# Patient Record
Sex: Male | Born: 1960
Health system: Southern US, Community
[De-identification: ages and names within clinical notes are randomized; demographics above are authoritative.]

## PROBLEM LIST (undated history)

## (undated) DIAGNOSIS — K8020 Calculus of gallbladder without cholecystitis without obstruction: Secondary | ICD-10-CM

## (undated) DIAGNOSIS — I6529 Occlusion and stenosis of unspecified carotid artery: Secondary | ICD-10-CM

## (undated) DIAGNOSIS — N189 Chronic kidney disease, unspecified: Secondary | ICD-10-CM

## (undated) DIAGNOSIS — I10 Essential (primary) hypertension: Secondary | ICD-10-CM

## (undated) DIAGNOSIS — E785 Hyperlipidemia, unspecified: Secondary | ICD-10-CM

## (undated) HISTORY — PX: TONSILLECTOMY: SUR1361

## (undated) HISTORY — DX: Occlusion and stenosis of unspecified carotid artery: I65.29

## (undated) HISTORY — DX: Hyperlipidemia, unspecified: E78.5

## (undated) SURGERY — LAPAROSCOPIC CHOLECYSTECTOMY
Anesthesia: General

---

## 2009-11-22 ENCOUNTER — Emergency Department: Payer: Self-pay | Admitting: Emergency Medicine

## 2011-11-04 ENCOUNTER — Ambulatory Visit: Payer: Self-pay | Admitting: General Practice

## 2011-12-17 ENCOUNTER — Ambulatory Visit: Payer: Self-pay | Admitting: General Practice

## 2012-03-22 ENCOUNTER — Ambulatory Visit: Payer: Self-pay | Admitting: General Practice

## 2013-07-07 ENCOUNTER — Emergency Department: Payer: Self-pay | Admitting: Emergency Medicine

## 2013-07-07 LAB — CBC
HCT: 47.8 % (ref 40.0–52.0)
HGB: 16.1 g/dL (ref 13.0–18.0)
MCH: 30.1 pg (ref 26.0–34.0)
MCHC: 33.8 g/dL (ref 32.0–36.0)
MCV: 89 fL (ref 80–100)
PLATELETS: 293 10*3/uL (ref 150–440)
RBC: 5.35 10*6/uL (ref 4.40–5.90)
RDW: 13.1 % (ref 11.5–14.5)
WBC: 19.4 10*3/uL — AB (ref 3.8–10.6)

## 2013-07-07 LAB — URINALYSIS, COMPLETE
BILIRUBIN, UR: NEGATIVE
Bacteria: NONE SEEN
Glucose,UR: NEGATIVE mg/dL (ref 0–75)
Ketone: NEGATIVE
LEUKOCYTE ESTERASE: NEGATIVE
NITRITE: NEGATIVE
PH: 5 (ref 4.5–8.0)
SQUAMOUS EPITHELIAL: NONE SEEN
Specific Gravity: 1.026 (ref 1.003–1.030)
WBC UR: <1 /HPF (ref 0–5)

## 2013-07-07 LAB — BASIC METABOLIC PANEL
Anion Gap: 10 (ref 7–16)
BUN: 22 mg/dL — ABNORMAL HIGH (ref 7–18)
Calcium, Total: 9.7 mg/dL (ref 8.5–10.1)
Chloride: 104 mmol/L (ref 98–107)
Co2: 28 mmol/L (ref 21–32)
Creatinine: 1.12 mg/dL (ref 0.60–1.30)
GLUCOSE: 147 mg/dL — AB (ref 65–99)
Osmolality: 289 (ref 275–301)
POTASSIUM: 3.5 mmol/L (ref 3.5–5.1)
SODIUM: 142 mmol/L (ref 136–145)

## 2013-07-18 DIAGNOSIS — N2889 Other specified disorders of kidney and ureter: Secondary | ICD-10-CM | POA: Insufficient documentation

## 2013-07-18 DIAGNOSIS — N281 Cyst of kidney, acquired: Secondary | ICD-10-CM | POA: Insufficient documentation

## 2015-08-01 ENCOUNTER — Other Ambulatory Visit: Payer: Self-pay | Admitting: Internal Medicine

## 2015-08-01 DIAGNOSIS — Z8249 Family history of ischemic heart disease and other diseases of the circulatory system: Secondary | ICD-10-CM | POA: Insufficient documentation

## 2015-08-01 DIAGNOSIS — Z8719 Personal history of other diseases of the digestive system: Secondary | ICD-10-CM | POA: Insufficient documentation

## 2015-08-01 DIAGNOSIS — I10 Essential (primary) hypertension: Secondary | ICD-10-CM | POA: Insufficient documentation

## 2015-08-01 DIAGNOSIS — R1010 Upper abdominal pain, unspecified: Secondary | ICD-10-CM

## 2015-08-01 DIAGNOSIS — Z87442 Personal history of urinary calculi: Secondary | ICD-10-CM | POA: Insufficient documentation

## 2015-08-04 ENCOUNTER — Ambulatory Visit
Admission: RE | Admit: 2015-08-04 | Discharge: 2015-08-04 | Disposition: A | Discharge status: Home / Self Care | Payer: BLUE CROSS/BLUE SHIELD | Source: Ambulatory Visit | Attending: Internal Medicine | Admitting: Internal Medicine

## 2015-08-04 DIAGNOSIS — K8020 Calculus of gallbladder without cholecystitis without obstruction: Secondary | ICD-10-CM | POA: Insufficient documentation

## 2015-08-04 DIAGNOSIS — R1010 Upper abdominal pain, unspecified: Secondary | ICD-10-CM

## 2015-08-05 ENCOUNTER — Other Ambulatory Visit: Payer: Self-pay

## 2015-08-07 ENCOUNTER — Encounter: Payer: Self-pay | Admitting: Surgery

## 2015-08-07 ENCOUNTER — Ambulatory Visit (INDEPENDENT_AMBULATORY_CARE_PROVIDER_SITE_OTHER): Payer: BLUE CROSS/BLUE SHIELD | Admitting: Surgery

## 2015-08-07 ENCOUNTER — Other Ambulatory Visit: Payer: Self-pay

## 2015-08-07 VITALS — BP 125/84 | HR 88 | Temp 98.7°F | Ht 72.0 in | Wt 216.0 lb

## 2015-08-07 DIAGNOSIS — K8020 Calculus of gallbladder without cholecystitis without obstruction: Secondary | ICD-10-CM

## 2015-08-07 NOTE — Patient Instructions (Addendum)
We will schedule your surgery on 08/21/2015 with Dr. Dahlia Byes at the [facility]. They will be contacting you to go over your medical record.  Please go and see your Primary care doctor in reference to your rash.

## 2015-08-08 ENCOUNTER — Telehealth: Payer: Self-pay | Admitting: Surgery

## 2015-08-08 ENCOUNTER — Encounter: Payer: Self-pay | Admitting: Surgery

## 2015-08-08 NOTE — Progress Notes (Signed)
Patient ID: Andre Wu, male   DOB: 1960/07/11, 55 y.o.   MRN: [id number]  History of Present Illness Andre Wu is a 55 y.o. male seen in consultation for biliary colic. Patient reports that he had a recent episode of right upper quadrant pain radiating to his back and shoulder. After some heavy meals. He developed some nausea as well. Went to the primary care doctor and was given some narcotics, PPI also has had a change in the hypertension medication and recently has developed a rash over his abdominal wall. No evidence of angioedema or airway issues related to anaphylactic reaction. From the biliary perspective he reports that the pain is intermittent and moderate in severity. The oral narcotics seem to help with his pain. No evidence of biliary obstruction. Further workup has included ultrasound revealing evidence of cholelithiasis, normal common bile duct and normal LFTs. The rest of his labs were reviewed and were normal ( PCP notes). He is otherwise healthy and is able to perform more than 6 Mets of activity without any shortness of breath or chest pain. No previous heart attacks in the previous strokes. 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  Past Medical History HTN  No Known Allergies  Current Outpatient Prescriptions  Medication Sig Dispense Refill  . hydrochlorothiazide (HYDRODIURIL) 25 MG tablet Take 25 mg by mouth.    . losartan (COZAAR) 50 MG tablet Take by mouth.    Marland Kitchen omeprazole (PRILOSEC) 20 MG capsule Take by mouth.    . traMADol (ULTRAM) 50 MG tablet Take 1 tablet by mouth 1 day or 1 dose.  1   No current facility-administered medications for this visit.    Family History No family history on file.    Social History Social History  Substance Use Topics  . Smoking status: Never Smoker   . Smokeless tobacco: None  . Alcohol Use: No      ROS 10 pt ROS was negative  Physical Exam Blood pressure 125/84, pulse 88, temperature 98.7 F (37.1  C), temperature source Oral, height 6' (1.829 m), weight 97.977 kg (216 lb).  CONSTITUTIONAL: NAD  EYES: Pupils equal, round, and reactive to light, Sclera non-icteric. EARS, NOSE, MOUTH AND THROAT: The oropharynx is clear. Oral mucosa is pink and moist. Hearing is intact to voice.  NECK: Trachea is midline, and there is no jugular venous distension. Thyroid is without palpable abnormalities. LYMPH NODES:  Lymph nodes in the neck are not enlarged. RESPIRATORY:  Lungs are clear, and breath sounds are equal bilaterally. Normal respiratory effort without pathologic use of accessory muscles. CARDIOVASCULAR: Heart is regular without murmurs, gallops, or rubs. GI: The abdomen is  soft, nontender, and nondistended. There were no palpable masses. There was no hepatosplenomegaly. There were normal bowel sounds. MUSCULOSKELETAL:  Normal muscle strength and tone in all four extremities.    SKIN: Andre Wu 3 hives on his abdominal wall there is some redness. No evidence of infection this is definitively an allergic reaction NEUROLOGIC:  Motor and sensation is grossly normal.  Cranial nerves are grossly intact. PSYCH:  Alert and oriented to person, place and time. Affect is normal.  Data Reviewed I have personally reviewed the patient's imaging and medical records.    Assessment/Plan Symptomatic gallstones. Discussed with the patient in detail and I do think that he will benefit from laparoscopic cholecystectomy. Before performing this I want to make sure that we will remove the offending agent that is causing the rash so we prevent any  further anesthetic or surgical complications. He will contact and the PCP office and so they can figure out which medication is causing this side effect. From the gallstone perspective I do think is reasonable to perform a laparoscopic cholecystectomy in 2-3 weeks once his reaction has resolved. I discussed the procedure in detail.  The patient was given Neurosurgeon.  We  discussed the risks and benefits of a laparoscopic cholecystectomy and possible cholangiogram including, but not limited to bleeding, infection, injury to surrounding structures such as the intestine or liver, bile leak, retained gallstones, need to convert to an open procedure, prolonged diarrhea, blood clots such as  DVT, common bile duct injury, anesthesia risks, and possible need for additional procedures.  The likelihood of improvement in symptoms and return to the patient's normal status is good. We discussed the typical post-operative recovery course. Patient is in agreement and all the questions were answered  Caroleen Hamman, MD [place] 08/08/2015, 9:05 AM

## 2015-08-08 NOTE — Telephone Encounter (Signed)
Spoke with Dr. Dahlia Byes and he would like to move patient's surgery 2 more weeks out on the schedule due to shingles. Please schedule patient to be seen in clinic with Dr. Dahlia Byes on 08/20/15 to ensure that he is ready for his surgery.

## 2015-08-08 NOTE — Telephone Encounter (Signed)
Pt's Wife was advised of pre op date/time and sx date. Sx: 08/21/15 with Dr Dahlia Byes @ [facility]--Laparoscopic cholecystectomy.  Pre op: [place] will contact the patient prior to surgery.   The wife has stated that the rash that was on the patient's stomach at his office visit yesterday was double the size this morning. Patient went to his PCP and was informed that he has shingles. The PCP stated to the patient that the pain could be from the shingles and not the gallbladder, even though the scan showed stones. Patient was given medication by PCP and was told that it was ok for him to continue with surgery   I advised her that I would contact the nurse and have her speak with Dr Dahlia Byes to see If the surgery will need to be rescheduled.   The phone number in the chart has been verified.

## 2015-08-08 NOTE — Telephone Encounter (Signed)
I spoke with patients wife Andre Wu due to husband resting. She stated the rash was double in size compared to when he was in the office yesterday. He is currently being treated by his PCP for shingles. She said her husband was having a lot of pain and discomfort from the shingles.  She stated at  this time they plan to move forward with having surgery on 08/21/15 due to having the gallstones and pain. I told her I would contact Dr.Pabon and let her know something for sure.   Text has been sent to Dr.Pabon to confirm moving forward with surgery.

## 2015-08-11 NOTE — Telephone Encounter (Signed)
I have called patient to advise him that we will need to change his surgery date due to him having shingles at this time. Patient would like to wait and see Dr Dahlia Byes towards the end of the month to see if his actual pain is coming from his shingles or from the gallbladder stones. I have scheduled him for his office visit on 09/10/15 with Dr Dahlia Byes @ 1:00pm in our [facility].   Patient was advised to call [place] if he needs to see [place] much sooner.

## 2015-09-10 ENCOUNTER — Ambulatory Visit: Payer: BLUE CROSS/BLUE SHIELD | Admitting: Surgery

## 2016-03-18 ENCOUNTER — Encounter: Payer: Self-pay | Admitting: *Deleted

## 2016-03-19 ENCOUNTER — Encounter: Payer: Self-pay | Admitting: *Deleted

## 2016-03-19 ENCOUNTER — Ambulatory Visit: Payer: BLUE CROSS/BLUE SHIELD | Admitting: Anesthesiology

## 2016-03-19 ENCOUNTER — Ambulatory Visit
Admission: RE | Admit: 2016-03-19 | Discharge: 2016-03-19 | Disposition: A | Discharge status: Home / Self Care | Payer: BLUE CROSS/BLUE SHIELD | Source: Ambulatory Visit | Attending: Gastroenterology | Admitting: Gastroenterology

## 2016-03-19 ENCOUNTER — Encounter
Admission: RE | Disposition: A | Discharge status: Home / Self Care | Payer: Self-pay | Source: Ambulatory Visit | Attending: Gastroenterology

## 2016-03-19 DIAGNOSIS — Z8371 Family history of colonic polyps: Secondary | ICD-10-CM | POA: Insufficient documentation

## 2016-03-19 DIAGNOSIS — D124 Benign neoplasm of descending colon: Secondary | ICD-10-CM | POA: Diagnosis not present

## 2016-03-19 DIAGNOSIS — K648 Other hemorrhoids: Secondary | ICD-10-CM | POA: Diagnosis not present

## 2016-03-19 DIAGNOSIS — N189 Chronic kidney disease, unspecified: Secondary | ICD-10-CM | POA: Insufficient documentation

## 2016-03-19 DIAGNOSIS — Z79899 Other long term (current) drug therapy: Secondary | ICD-10-CM | POA: Diagnosis not present

## 2016-03-19 DIAGNOSIS — I129 Hypertensive chronic kidney disease with stage 1 through stage 4 chronic kidney disease, or unspecified chronic kidney disease: Secondary | ICD-10-CM | POA: Diagnosis not present

## 2016-03-19 DIAGNOSIS — Z6829 Body mass index (BMI) 29.0-29.9, adult: Secondary | ICD-10-CM | POA: Diagnosis not present

## 2016-03-19 DIAGNOSIS — E669 Obesity, unspecified: Secondary | ICD-10-CM | POA: Insufficient documentation

## 2016-03-19 DIAGNOSIS — D123 Benign neoplasm of transverse colon: Secondary | ICD-10-CM | POA: Diagnosis not present

## 2016-03-19 DIAGNOSIS — Z1211 Encounter for screening for malignant neoplasm of colon: Secondary | ICD-10-CM | POA: Insufficient documentation

## 2016-03-19 DIAGNOSIS — K5730 Diverticulosis of large intestine without perforation or abscess without bleeding: Secondary | ICD-10-CM | POA: Diagnosis not present

## 2016-03-19 HISTORY — DX: Chronic kidney disease, unspecified: N18.9

## 2016-03-19 HISTORY — DX: Essential (primary) hypertension: I10

## 2016-03-19 HISTORY — DX: Calculus of gallbladder without cholecystitis without obstruction: K80.20

## 2016-03-19 HISTORY — PX: COLONOSCOPY WITH PROPOFOL: SHX5780

## 2016-03-19 SURGERY — COLONOSCOPY WITH PROPOFOL
Anesthesia: General

## 2016-03-19 MED ORDER — PROPOFOL 500 MG/50ML IV EMUL
INTRAVENOUS | Status: DC | PRN
  Administered 2016-03-19: 150 ug/kg/min via INTRAVENOUS

## 2016-03-19 MED ORDER — PHENYLEPHRINE HCL 10 MG/ML IJ SOLN
INTRAMUSCULAR | Status: DC | PRN
  Administered 2016-03-19: 200 ug via INTRAVENOUS

## 2016-03-19 MED ORDER — SODIUM CHLORIDE 0.9 % IV SOLN: INTRAVENOUS | Status: DC

## 2016-03-19 MED ORDER — MIDAZOLAM HCL 2 MG/2ML IJ SOLN
INTRAMUSCULAR | Status: AC
  Filled 2016-03-19: qty 2

## 2016-03-19 MED ORDER — LIDOCAINE HCL (CARDIAC) 20 MG/ML IV SOLN
INTRAVENOUS | Status: DC | PRN
  Administered 2016-03-19: 100 mg via INTRAVENOUS

## 2016-03-19 MED ORDER — LIDOCAINE HCL (PF) 2 % IJ SOLN
INTRAMUSCULAR | Status: AC
  Filled 2016-03-19: qty 2

## 2016-03-19 MED ORDER — PROPOFOL 500 MG/50ML IV EMUL
INTRAVENOUS | Status: AC
  Filled 2016-03-19: qty 50

## 2016-03-19 MED ORDER — MIDAZOLAM HCL 2 MG/2ML IJ SOLN
INTRAMUSCULAR | Status: DC | PRN
  Administered 2016-03-19: 2 mg via INTRAVENOUS

## 2016-03-19 MED ORDER — SODIUM CHLORIDE 0.9 % IV SOLN
INTRAVENOUS | Status: DC
  Administered 2016-03-19: 1000 mL via INTRAVENOUS
  Administered 2016-03-19: 15:00:00 via INTRAVENOUS

## 2016-03-19 MED ORDER — PROPOFOL 10 MG/ML IV BOLUS
INTRAVENOUS | Status: DC | PRN
  Administered 2016-03-19: 50 mg via INTRAVENOUS

## 2016-03-19 NOTE — Anesthesia Post-op Follow-up Note (Cosign Needed)
Anesthesia QCDR form completed.        

## 2016-03-19 NOTE — H&P (Signed)
Outpatient short stay form Pre-procedure 03/19/2016 2:45 PM Andre Sails MD  Primary Physician: Dr Tracie Harrier  Reason for visit:  Colonoscopy  History of present illness:  Patient is a 56 year old male presenting today as above. There is family history of colon polyps in his father. Rated his prep well. He does take a many dose of aspirin, 81 mg, that has been held for a couple days. He takes no other aspirin or blood thinning agents.    Current Facility-Administered Medications:  .  0.9 %  sodium chloride infusion, , Intravenous, Continuous, Andre Sails, MD .  0.9 %  sodium chloride infusion, , Intravenous, Continuous, Andre Sails, MD  Prescriptions Prior to Admission  Medication Sig Dispense Refill Last Dose  . losartan (COZAAR) 50 MG tablet Take by mouth.   03/19/2016 at 0900  . omeprazole (PRILOSEC) 20 MG capsule Take by mouth.   Not Taking at Unknown time  . traMADol (ULTRAM) 50 MG tablet Take 1 tablet by mouth 1 day or 1 dose.  1 Not Taking at Unknown time     No Known Allergies   Past Medical History:  Diagnosis Date  . Chronic kidney disease   . Gall stones   . Hypertension     Review of systems:      Physical Exam    Heart and lungs: Regular rate and rhythm without rub or gallop, lungs are bilaterally clear.    HEENT: Normocephalic atraumatic eyes are anicteric    Other:     Pertinant exam for procedure: Soft nontender nondistended bowel sounds positive normoactive.    Planned proceedures: Colonoscopy and indicated procedures. I have discussed the risks benefits and complications of procedures to include not limited to bleeding, infection, perforation and the risk of sedation and the patient wishes to proceed.    Andre Sails, MD Gastroenterology 03/19/2016  2:45 PM

## 2016-03-19 NOTE — Anesthesia Postprocedure Evaluation (Signed)
Anesthesia Post Note  Patient: Andre Wu  Procedure(s) Performed: Procedure(s) (LRB): COLONOSCOPY WITH PROPOFOL (N/A)  Patient location during evaluation: Endoscopy Anesthesia Type: General Level of consciousness: awake and alert Pain management: pain level controlled Vital Signs Assessment: post-procedure vital signs reviewed and stable Respiratory status: spontaneous breathing, nonlabored ventilation, respiratory function stable and patient connected to nasal cannula oxygen Cardiovascular status: blood pressure returned to baseline and stable Postop Assessment: no signs of nausea or vomiting Anesthetic complications: no     Last Vitals:  Vitals:   03/19/16 1614 03/19/16 1624  BP: 124/81 118/77  Pulse: 75   Resp: 13 14  Temp:      Last Pain:  Vitals:   03/19/16 1554  TempSrc: Tympanic                 Martha Clan

## 2016-03-19 NOTE — Anesthesia Preprocedure Evaluation (Signed)
Anesthesia Evaluation  Patient identified by MRN, date of birth, ID band Patient awake    Reviewed: Allergy & Precautions, NPO status , Patient's Chart, lab work & pertinent test results  Airway Mallampati: II       Dental  (+) Teeth Intact   Pulmonary neg pulmonary ROS,    breath sounds clear to auscultation       Cardiovascular hypertension, Pt. on medications  Rhythm:Regular Rate:Normal     Neuro/Psych negative neurological ROS     GI/Hepatic negative GI ROS, Neg liver ROS,   Endo/Other  Morbid obesity  Renal/GU negative Renal ROS     Musculoskeletal negative musculoskeletal ROS (+)   Abdominal (+) + obese,   Peds negative pediatric ROS (+)  Hematology negative hematology ROS (+)   Anesthesia Other Findings   Reproductive/Obstetrics                             Anesthesia Physical Anesthesia Plan  ASA: II  Anesthesia Plan: General   Post-op Pain Management:    Induction: Intravenous  Airway Management Planned: Natural Airway and Nasal Cannula  Additional Equipment:   Intra-op Plan:   Post-operative Plan:   Informed Consent: I have reviewed the patients History and Physical, chart, labs and discussed the procedure including the risks, benefits and alternatives for the proposed anesthesia with the patient or authorized representative who has indicated his/her understanding and acceptance.     Plan Discussed with: CRNA and Surgeon  Anesthesia Plan Comments:         Anesthesia Quick Evaluation

## 2016-03-19 NOTE — Transfer of Care (Signed)
Immediate Anesthesia Transfer of Care Note  Patient: Andre Wu  Procedure(s) Performed: Procedure(s): COLONOSCOPY WITH PROPOFOL (N/A)  Patient Location: Endoscopy Unit  Anesthesia Type:General  Level of Consciousness: awake, oriented and patient cooperative  Airway & Oxygen Therapy: Patient Spontanous Breathing and Patient connected to nasal cannula oxygen  Post-op Assessment: Report given to RN, Post -op Vital signs reviewed and stable and Patient moving all extremities X 4  Post vital signs: Reviewed and stable  Last Vitals:  Vitals:   03/19/16 1425  BP: (!) 155/94  Pulse: 88  Resp: 18  Temp: 36.6 C    Last Pain:  Vitals:   03/19/16 1425  TempSrc: Tympanic         Complications: No apparent anesthesia complications

## 2016-03-19 NOTE — Op Note (Signed)
[facility] Gastroenterology Patient Name: Andre Wu Procedure Date: 03/19/2016 2:58 PM MRN: [id number] Account #: [id number] Date of Birth: 08-11-1960 Admit Type: Outpatient Age: 56 Room: [facility] ENDO ROOM 3 Gender: Male Note Status: Finalized Procedure:            Colonoscopy Indications:          Colon cancer screening in patient at increased risk:                        Family history of 1st-degree relative with colon polyps Providers:            Lollie Sails, MD Referring MD:         Tracie Harrier, MD (Referring MD) Medicines:            Monitored Anesthesia Care Complications:        No immediate complications. Procedure:            Pre-Anesthesia Assessment:                       - ASA Grade Assessment: II - A patient with mild                        systemic disease.                       After obtaining informed consent, the colonoscope was                        passed under direct vision. Throughout the procedure,                        the patient's blood pressure, pulse, and oxygen                        saturations were monitored continuously. The                        Colonoscope was introduced through the anus and                        advanced to the the cecum, identified by appendiceal                        orifice and ileocecal valve. The colonoscopy was                        performed with moderate difficulty due to significant                        looping and a tortuous colon. Successful completion of                        the procedure was aided by changing the patient to a                        supine position, changing the patient to a prone                        position and using manual pressure. The patient  tolerated the procedure well. The quality of the bowel                        preparation was fair. Findings:      Multiple small-mouthed diverticula were found in the sigmoid colon and        descending colon.      A 4 mm polyp was found in the descending colon. The polyp was sessile.       The polyp was removed with a cold snare. Resection and retrieval were       complete.      A 3 mm polyp was found in the hepatic flexure. The polyp was sessile.       The polyp was removed with a cold snare. Resection and retrieval were       complete.      A 5 mm polyp was found in the transverse colon. The polyp was       semi-pedunculated. The polyp was removed with a cold snare. Resection       and retrieval were complete. To prevent bleeding after the polypectomy,       one hemostatic clip was successfully placed. There was no bleeding at       the end of the maneuver.      A 2 mm polyp was found in the transverse colon. The polyp was sessile.       The polyp was removed with a cold biopsy forceps. Resection and       retrieval were complete.      Non-bleeding internal hemorrhoids were found during retroflexion and       during anoscopy. The hemorrhoids were small and Grade I (internal       hemorrhoids that do not prolapse).      The digital rectal exam was normal. Impression:           - Preparation of the colon was fair.                       - Diverticulosis in the sigmoid colon and in the                        descending colon.                       - One 4 mm polyp in the descending colon, removed with                        a cold snare. Resected and retrieved.                       - One 3 mm polyp at the hepatic flexure, removed with a                        cold snare. Resected and retrieved.                       - One 5 mm polyp in the transverse colon, removed with                        a cold snare. Resected and retrieved. Clip was placed.                       -  One 2 mm polyp in the transverse colon, removed with                        a cold biopsy forceps. Resected and retrieved.                       - Non-bleeding internal hemorrhoids. Recommendation:        - Discharge patient to home. Procedure Code(s):    --- Professional ---                       [phone], Colonoscopy, flexible; with removal of tumor(s),                        polyp(s), or other lesion(s) by snare technique                       [id number], 27, Colonoscopy, flexible; with biopsy, single                        or multiple Diagnosis Code(s):    --- Professional ---                       Z83.71, Family history of colonic polyps                       K64.0, First degree hemorrhoids                       D12.4, Benign neoplasm of descending colon                       D12.3, Benign neoplasm of transverse colon (hepatic                        flexure or splenic flexure)                       K57.30, Diverticulosis of large intestine without                        perforation or abscess without bleeding CPT copyright 2016 American Medical Association. All rights reserved. The codes documented in this report are preliminary and upon coder review may  be revised to meet current compliance requirements. Lollie Sails, MD 03/19/2016 3:52:50 PM This report has been signed electronically. Number of Addenda: 0 Note Initiated On: 03/19/2016 2:58 PM Scope Withdrawal Time: 0 hours 21 minutes 54 seconds  Total Procedure Duration: 0 hours 41 minutes 42 seconds       [facility]

## 2016-03-22 ENCOUNTER — Encounter: Payer: Self-pay | Admitting: Gastroenterology

## 2016-03-23 LAB — SURGICAL PATHOLOGY

## 2017-06-24 ENCOUNTER — Telehealth: Payer: Self-pay | Admitting: *Deleted

## 2017-06-24 DIAGNOSIS — Z87891 Personal history of nicotine dependence: Secondary | ICD-10-CM

## 2017-06-24 DIAGNOSIS — Z122 Encounter for screening for malignant neoplasm of respiratory organs: Secondary | ICD-10-CM

## 2017-06-24 NOTE — Telephone Encounter (Signed)
Received referral for initial lung cancer screening scan. Contacted patient and obtained smoking history,(former, quit 14 years ago, 111 pack year) as well as answering questions related to screening process. Patient denies signs of lung cancer such as weight loss or hemoptysis. Patient denies comorbidity that would prevent curative treatment if lung cancer were found. Patient is scheduled for shared decision making visit and CT scan on 07/14/17.

## 2017-07-14 ENCOUNTER — Inpatient Hospital Stay: Payer: BLUE CROSS/BLUE SHIELD | Attending: Nurse Practitioner | Admitting: Nurse Practitioner

## 2017-07-14 ENCOUNTER — Ambulatory Visit
Admission: RE | Admit: 2017-07-14 | Discharge: 2017-07-14 | Disposition: A | Discharge status: Home / Self Care | Payer: BLUE CROSS/BLUE SHIELD | Source: Ambulatory Visit | Attending: Nurse Practitioner | Admitting: Nurse Practitioner

## 2017-07-14 ENCOUNTER — Encounter: Payer: Self-pay | Admitting: Nurse Practitioner

## 2017-07-14 DIAGNOSIS — Z122 Encounter for screening for malignant neoplasm of respiratory organs: Secondary | ICD-10-CM

## 2017-07-14 DIAGNOSIS — I2510 Atherosclerotic heart disease of native coronary artery without angina pectoris: Secondary | ICD-10-CM | POA: Insufficient documentation

## 2017-07-14 DIAGNOSIS — K8020 Calculus of gallbladder without cholecystitis without obstruction: Secondary | ICD-10-CM | POA: Insufficient documentation

## 2017-07-14 DIAGNOSIS — Z87891 Personal history of nicotine dependence: Secondary | ICD-10-CM

## 2017-07-14 DIAGNOSIS — I700 Atherosclerosis of aorta: Secondary | ICD-10-CM | POA: Diagnosis not present

## 2017-07-15 NOTE — Progress Notes (Signed)
In accordance with CMS guidelines, patient has met eligibility criteria including age, absence of signs or symptoms of lung cancer.  Social History   Tobacco Use  . Smoking status: Former Smoker    Packs/day: 3.00    Years: 37.00    Pack years: 111.00    Last attempt to quit: 2005    Years since quitting: 14.4  . Smokeless tobacco: Never Used  Substance Use Topics  . Alcohol use: No    Alcohol/week: 0.0 oz  . Drug use: No      A shared decision-making session was conducted prior to the performance of CT scan. This includes one or more decision aids, includes benefits and harms of screening, follow-up diagnostic testing, over-diagnosis, false positive rate, and total radiation exposure.   Counseling on the importance of adherence to annual lung cancer LDCT screening, impact of co-morbidities, and ability or willingness to undergo diagnosis and treatment is imperative for compliance of the program.   Counseling on the importance of continued smoking cessation for former smokers; the importance of smoking cessation for current smokers, and information about tobacco cessation interventions have been given to patient including [place] and 1800 quit [place] programs.   Written order for lung cancer screening with LDCT has been given to the patient and any and all questions have been answered to the best of my abilities.    Yearly follow up will be coordinated by Burgess Estelle, Thoracic Navigator.  Beckey Rutter, DNP, AGNP-C [place] at [facility] [phone] (work cell) [phone] (office) 07/15/17 8:58 AM

## 2017-07-18 ENCOUNTER — Telehealth: Payer: Self-pay | Admitting: *Deleted

## 2017-07-18 NOTE — Telephone Encounter (Signed)
Notified patient of LDCT lung cancer screening program results. Also notified of incidental findings noted below and is encouraged to discuss further with PCP who will receive a copy of this note and/or the CT report. Patient verbalizes understanding. Patient is aware that due to quit date he will not be eligible for continued lung screening scans. However, his PCP may choose to order imaging to follow up on known lung nodule.   IMPRESSION: 1. Lung-RADS 2, benign appearance or behavior. Continue annual screening with low-dose chest CT without contrast in 12 months. 2. Three-vessel coronary atherosclerosis. 3. Cholelithiasis.

## 2018-01-31 IMAGING — US US ABDOMEN LIMITED
2 series · 14 of 25 positions shown · non-contrast
Comparison: CT scan of July 07, 2013.

CLINICAL DATA: Upper abdominal pain.

EXAM:
US ABDOMEN LIMITED - RIGHT UPPER QUADRANT

[Series 1: us abdomen limited · 0.26mm/px · 13 of 54 slices shown (1 of 2)]
[im 1/54]
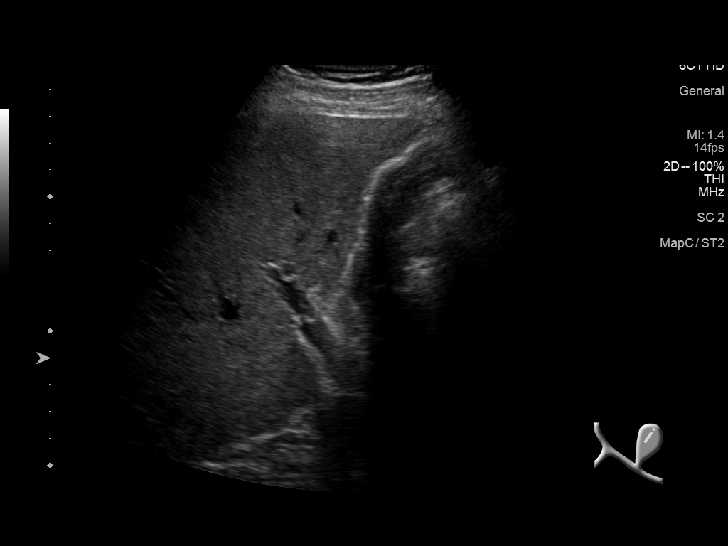
[im 5/54]
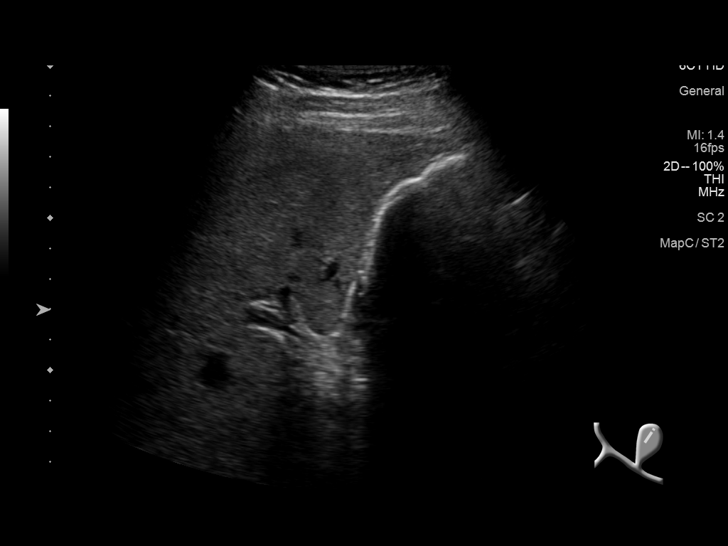
[im 10/54]
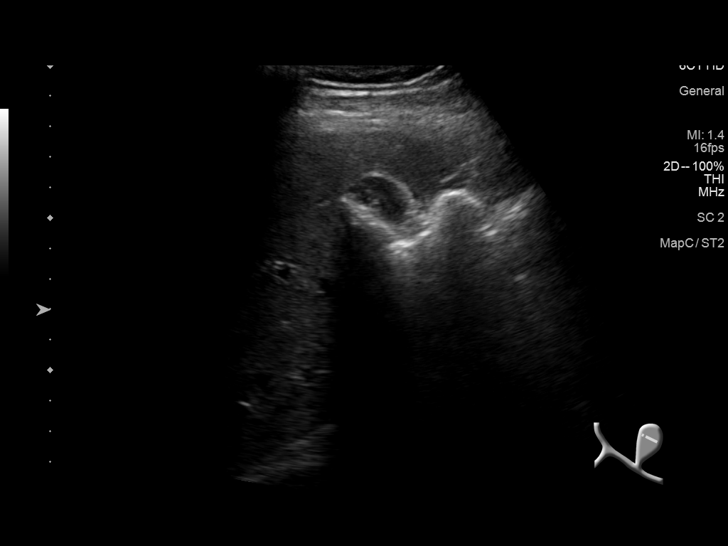
[im 14/54]
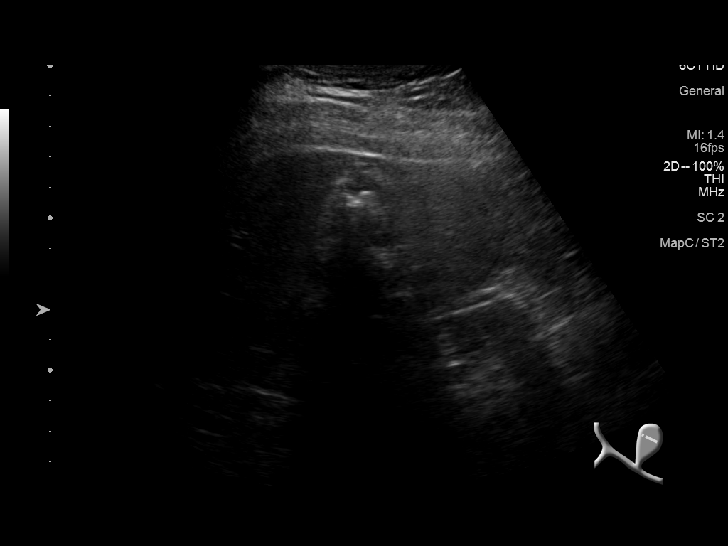
[im 19/54]
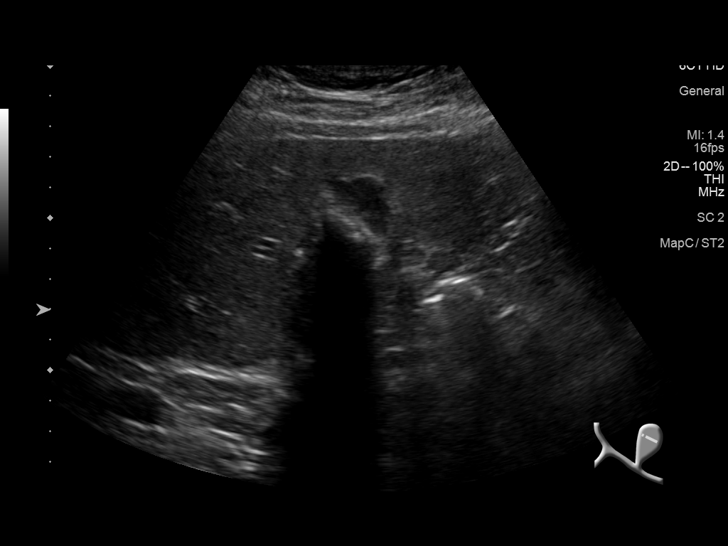
[im 21/54]
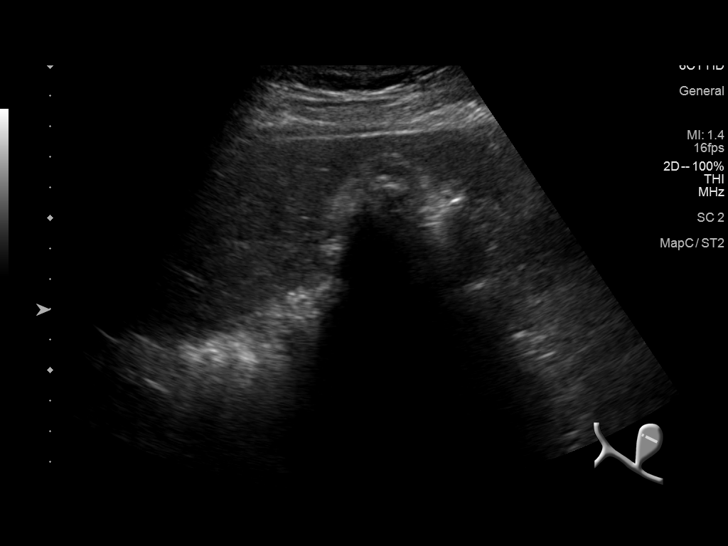
[im 26/54]
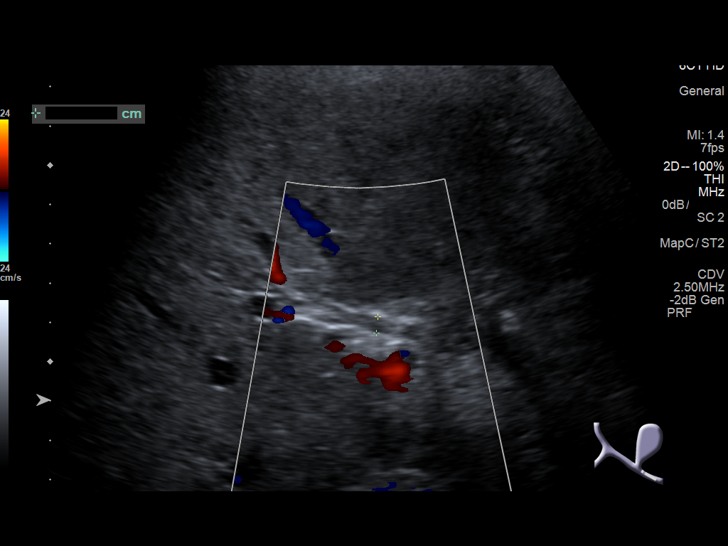
[im 30/54]
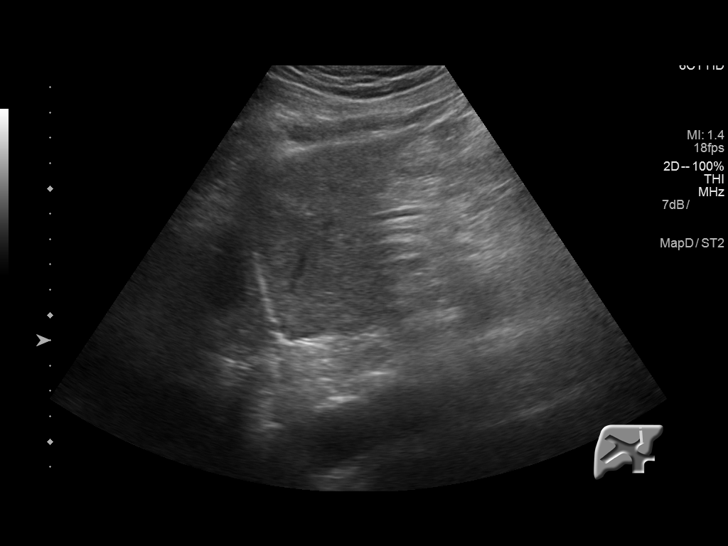
[im 35/54]
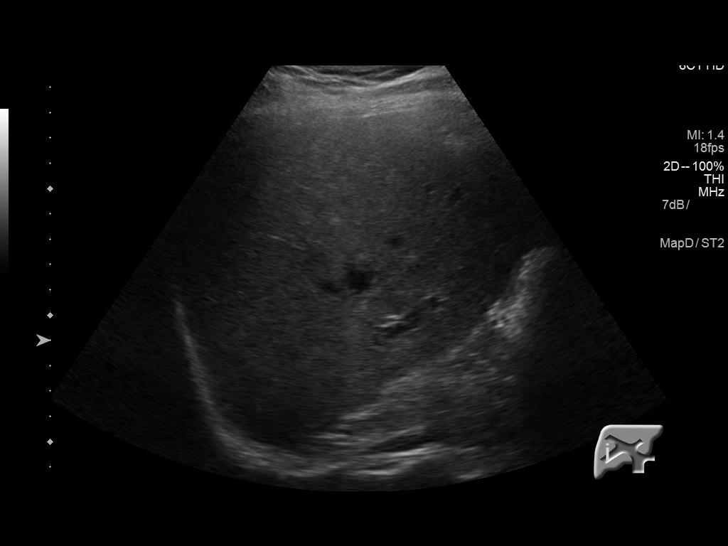
[im 37/54]
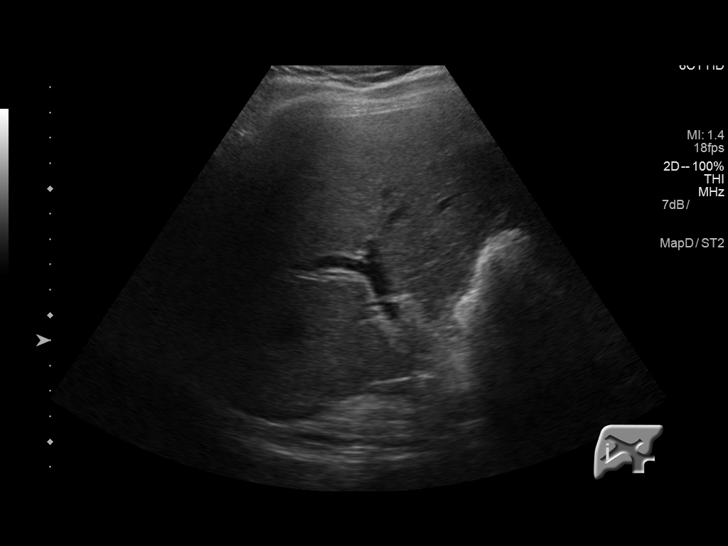
[im 42/54]
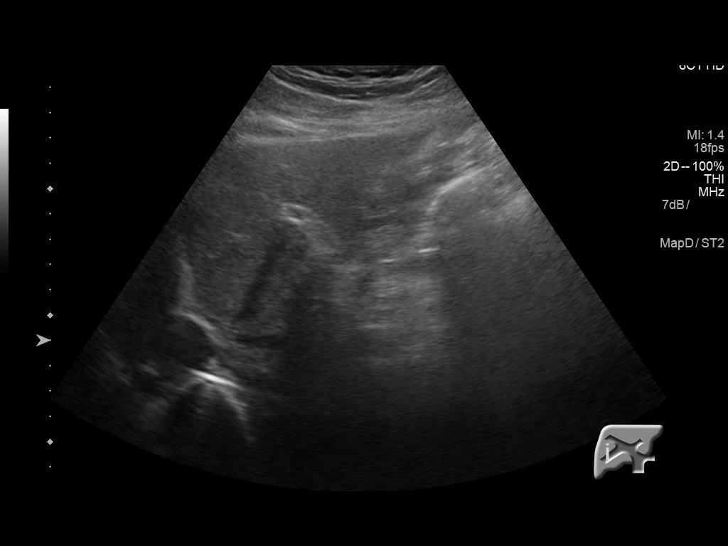
[im 47/54]
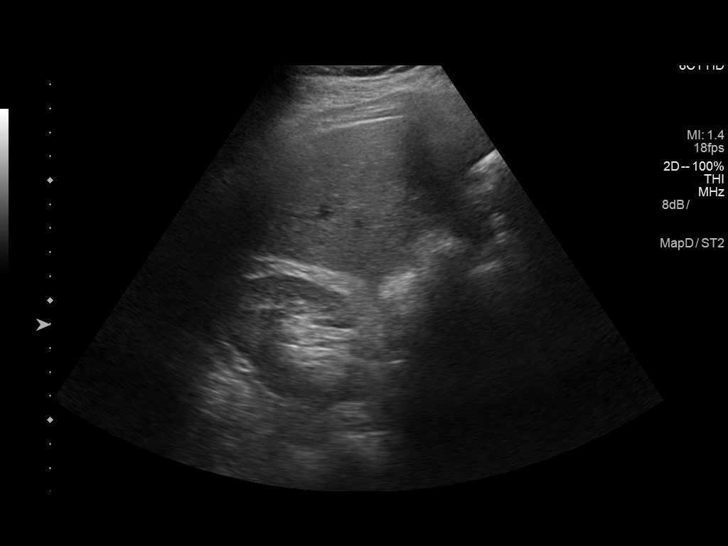
[im 51/54]
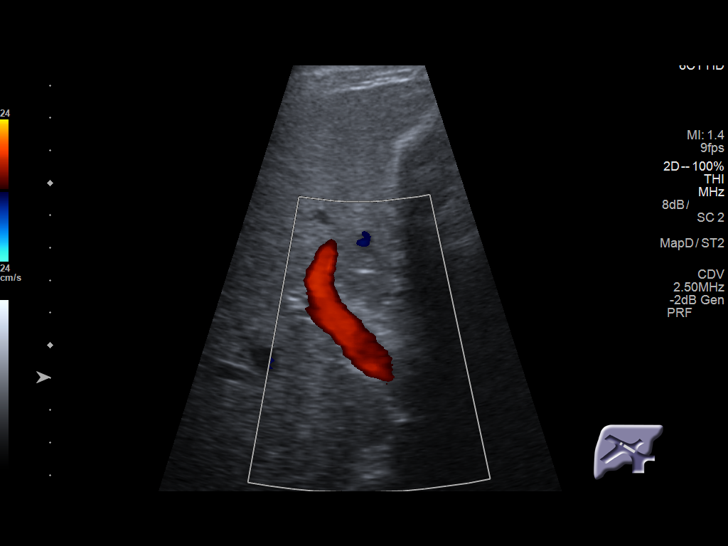

[Series 2001: us abdomen limited · 0.23mm/px · 1 of 1 slices shown (2 of 2)]
[im 1/1]
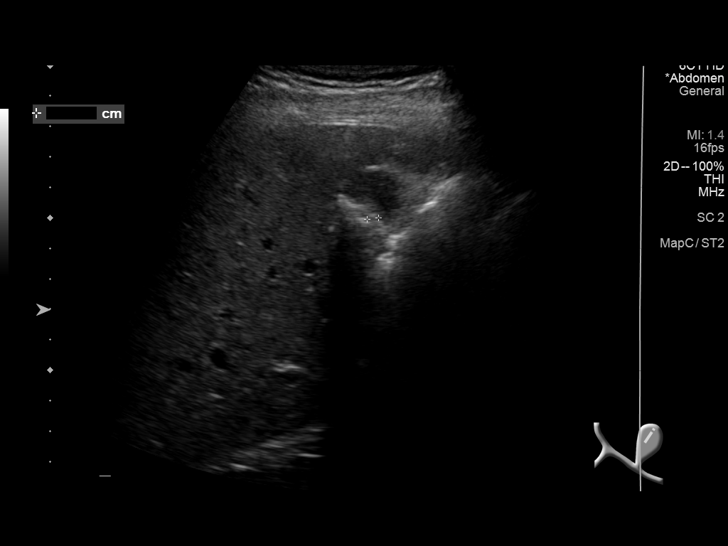

[14 of 25 positions shown; findings below may reference images not displayed]

FINDINGS: Gallbladder:

Multiple gallstones are noted with the largest measuring 3.8 mm.
Sludge is noted as well. No significant gallbladder wall thickening
or pericholecystic fluid is noted. No sonographic Murphy's sign is
noted.

Common bile duct:

Diameter: 4.2 mm which is within normal limits.

Liver:

No focal lesion identified. Within normal limits in parenchymal
echogenicity.
IMPRESSION: Cholelithiasis is noted without definite gallbladder wall thickening
or pericholecystic fluid. If there is clinical concern for
cholecystitis, HIDA scan is recommended for further evaluation.

## 2018-05-18 ENCOUNTER — Encounter: Payer: Self-pay | Admitting: *Deleted

## 2018-07-28 ENCOUNTER — Telehealth: Payer: Self-pay | Admitting: *Deleted

## 2018-07-28 ENCOUNTER — Telehealth: Payer: Self-pay

## 2018-07-28 DIAGNOSIS — Z122 Encounter for screening for malignant neoplasm of respiratory organs: Secondary | ICD-10-CM

## 2018-07-28 DIAGNOSIS — Z87891 Personal history of nicotine dependence: Secondary | ICD-10-CM

## 2018-07-28 NOTE — Telephone Encounter (Signed)
Patient has been notified that annual lung cancer screening low dose CT scan is due currently or will be in near future. Confirmed that patient is within the age range of 55-77, and asymptomatic, (no signs or symptoms of lung cancer). Patient denies illness that would prevent curative treatment for lung cancer if found. Verified smoking history, (former, quit 14 year ago, 111 pack year). The shared decision making visit was done 07/15/17. Patient is agreeable for CT scan being scheduled.

## 2018-07-28 NOTE — Telephone Encounter (Signed)
Call pt regarding lung screening. Pt is a former smoker. Pt denies any new health issue at this time. Scan is scheduled for June 16 th at 1:45.

## 2018-08-01 ENCOUNTER — Other Ambulatory Visit: Payer: Self-pay

## 2018-08-01 ENCOUNTER — Ambulatory Visit
Admission: RE | Admit: 2018-08-01 | Discharge: 2018-08-01 | Disposition: A | Discharge status: Home / Self Care | Payer: BC Managed Care – PPO | Source: Ambulatory Visit | Attending: Oncology | Admitting: Oncology

## 2018-08-01 DIAGNOSIS — Z87891 Personal history of nicotine dependence: Secondary | ICD-10-CM | POA: Diagnosis present

## 2018-08-01 DIAGNOSIS — Z122 Encounter for screening for malignant neoplasm of respiratory organs: Secondary | ICD-10-CM

## 2018-08-03 ENCOUNTER — Encounter: Payer: Self-pay | Admitting: *Deleted

## 2018-09-13 DIAGNOSIS — G4733 Obstructive sleep apnea (adult) (pediatric): Secondary | ICD-10-CM | POA: Diagnosis not present

## 2018-09-23 NOTE — Progress Notes (Signed)
Cardiology Office Note  Date:  09/25/2018   ID:  Andre Wu, DOB 1960-09-23, MRN [id number]  PCP:  Tracie Harrier, MD   Chief Complaint  Patient presents with  . other    Family hx CAD c/o chest pain when working out. Meds reviewed verbally with pt.    HPI:  Mr. Andre Wu is a 58 year old gentleman with past medical history of HTN GERD Former smoker, quit 58 yo Family hx of cad Referred by Dr. Ginette Pitman for risk factor profile evaluation, review of CT scan chest  In general he reports doing well, used to work out at the gym but not as much recently Weight has been slowly trending upwards, glucose levels trending up Recent glucose 120  Denies any anginal symptoms on a regular basis In the past if he pushed his body at the gym to the extreme would have some chest tightness and shortness of breath.  But only under very heavy exertion Resolved with resting  CT scan chest dated 08/01/2018 Images pulled up and reviewed with him in detail Minimal if any atherosclerotic calcification of the aorta in the arch Mild diffuse three-vessel coronary calcification in the coronary arteries. Centrilobular emphysema.  EKG personally reviewed by myself on todays visit Shows normal sinus rhythm with rate 72 bpm no significant ST or T wave changes   PMH:   has a past medical history of Carotid artery occlusion, Chronic kidney disease, Gall stones, Hyperlipidemia, and Hypertension.  PSH:    Past Surgical History:  Procedure Laterality Date  . COLONOSCOPY WITH PROPOFOL N/A 03/19/2016   Procedure: COLONOSCOPY WITH PROPOFOL;  Surgeon: Lollie Sails, MD;  Location: [facility] ENDOSCOPY;  Service: Endoscopy;  Laterality: N/A;  . TONSILLECTOMY      Current Outpatient Medications  Medication Sig Dispense Refill  . Fluticasone-Umeclidin-Vilant (TRELEGY ELLIPTA) 100-62.5-25 MCG/INH AEPB Inhale into the lungs daily.    Marland Kitchen omeprazole (PRILOSEC) 20 MG capsule Take by mouth.    .  telmisartan (MICARDIS) 40 MG tablet Take 40 mg by mouth daily.    Marland Kitchen aspirin EC 81 MG tablet Take by mouth daily.    Marland Kitchen atorvastatin (LIPITOR) 20 MG tablet Take 1 tablet (20 mg total) by mouth daily. 30 tablet 6  . ezetimibe (ZETIA) 10 MG tablet Take 1 tablet (10 mg total) by mouth daily. 30 tablet 6   No current facility-administered medications for this visit.      Allergies:   Patient has no known allergies.   Social History:  The patient  reports that he quit smoking about 15 years ago. He has a 111.00 pack-year smoking history. His smokeless tobacco use includes chew. He reports that he does not drink alcohol or use drugs.   Family History:   family history includes Heart attack in his brother, father, paternal uncle, paternal uncle, paternal uncle, and paternal uncle.    Review of Systems: Review of Systems  Constitutional: Negative.   HENT: Negative.   Respiratory: Negative.   Cardiovascular: Negative.   Gastrointestinal: Negative.   Musculoskeletal: Negative.   Neurological: Negative.   Psychiatric/Behavioral: Negative.   All other systems reviewed and are negative.   PHYSICAL EXAM: VS:  BP 130/84 (BP Location: Right Arm, Patient Position: Sitting, Cuff Size: Normal)   Pulse 72   Ht 6' (1.829 m)   Wt 238 lb 8 oz (108.2 kg)   BMI 32.35 kg/m  , BMI Body mass index is 32.35 kg/m. GEN: Well nourished, well developed, in no acute distress HEENT: normal  Neck: no JVD, carotid bruits, or masses Cardiac: RRR; no murmurs, rubs, or gallops,no edema  Respiratory:  clear to auscultation bilaterally, normal work of breathing GI: soft, nontender, nondistended, + BS MS: no deformity or atrophy Skin: warm and dry, no rash Neuro:  Strength and sensation are intact Psych: euthymic mood, full affect   Recent Labs: No results found for requested labs within last 8760 hours.    Lipid Panel No results found for: CHOL, HDL, LDLCALC, TRIG    Wt Readings from Last 3 Encounters:   09/25/18 238 lb 8 oz (108.2 kg)  08/01/18 230 lb (104.3 kg)  07/14/17 235 lb (106.6 kg)       ASSESSMENT AND PLAN:  Problem List Items Addressed This Visit      Cardiology Problems   Benign essential HTN   Relevant Medications   telmisartan (MICARDIS) 40 MG tablet   aspirin EC 81 MG tablet   ezetimibe (ZETIA) 10 MG tablet   atorvastatin (LIPITOR) 20 MG tablet    Other Visit Diagnoses    Aortic atherosclerosis ([place])    -  Primary   Relevant Medications   telmisartan (MICARDIS) 40 MG tablet   aspirin EC 81 MG tablet   ezetimibe (ZETIA) 10 MG tablet   atorvastatin (LIPITOR) 20 MG tablet   Coronary artery disease due to calcified coronary lesion       Relevant Medications   telmisartan (MICARDIS) 40 MG tablet   aspirin EC 81 MG tablet   ezetimibe (ZETIA) 10 MG tablet   atorvastatin (LIPITOR) 20 MG tablet   Other Relevant Orders   EKG 12-Lead   Former smoker       Mixed hyperlipidemia       Relevant Medications   telmisartan (MICARDIS) 40 MG tablet   aspirin EC 81 MG tablet   ezetimibe (ZETIA) 10 MG tablet   atorvastatin (LIPITOR) 20 MG tablet   Gastroesophageal reflux disease without esophagitis         Minimal coronary calcification seen on CT scan chest Not having significant symptoms concerning for angina We have recommended more aggressive regimen for his cholesterol We will increase Lipitor up to 20 mg daily We will add Zetia 10 mg daily for goal LDL less than 70 We did discuss anginal symptoms to watch for  Aortic atherosclerosis Minimal noted in the arch Images pulled up and discussed with him Plan as above  Hypertension Blood pressure is well controlled on today's visit. No changes made to the medications.  Disposition:   F/U as needed  Extensive time spent discussing his disease, CT scan images pulled up and reviewed with him in detail Types of cardiac testing discussed with him including stress testing Various treatment options discussed for his  lipid management  Total encounter time more than 60 minutes  Greater than 50% was spent in counseling and coordination of care with the patient    Signed, Esmond Plants, M.D., Ph.D. [place], [place]

## 2018-09-25 ENCOUNTER — Encounter: Payer: Self-pay | Admitting: Cardiovascular Disease

## 2018-09-25 ENCOUNTER — Other Ambulatory Visit: Payer: Self-pay

## 2018-09-25 ENCOUNTER — Ambulatory Visit (INDEPENDENT_AMBULATORY_CARE_PROVIDER_SITE_OTHER): Payer: 59 | Admitting: Cardiovascular Disease

## 2018-09-25 VITALS — BP 130/84 | HR 72 | Ht 72.0 in | Wt 238.5 lb

## 2018-09-25 DIAGNOSIS — I2510 Atherosclerotic heart disease of native coronary artery without angina pectoris: Secondary | ICD-10-CM

## 2018-09-25 DIAGNOSIS — I2584 Coronary atherosclerosis due to calcified coronary lesion: Secondary | ICD-10-CM | POA: Diagnosis not present

## 2018-09-25 DIAGNOSIS — Z87891 Personal history of nicotine dependence: Secondary | ICD-10-CM

## 2018-09-25 DIAGNOSIS — K219 Gastro-esophageal reflux disease without esophagitis: Secondary | ICD-10-CM

## 2018-09-25 DIAGNOSIS — E782 Mixed hyperlipidemia: Secondary | ICD-10-CM | POA: Diagnosis not present

## 2018-09-25 DIAGNOSIS — I10 Essential (primary) hypertension: Secondary | ICD-10-CM

## 2018-09-25 DIAGNOSIS — I700 Atherosclerosis of aorta: Secondary | ICD-10-CM

## 2018-09-25 MED ORDER — ATORVASTATIN CALCIUM 20 MG PO TABS: 20.0000 mg | ORAL_TABLET | Freq: Every day | ORAL | 6 refills | Status: AC

## 2018-09-25 MED ORDER — EZETIMIBE 10 MG PO TABS: 10.0000 mg | ORAL_TABLET | Freq: Every day | ORAL | 6 refills | Status: DC

## 2018-09-25 NOTE — Patient Instructions (Signed)
Medication Instructions:  Please increase the atorvastatin up to 20 mg daily Add zetia 10 mg daily  If you need a refill on your cardiac medications before your next appointment, please call your pharmacy.    Lab work: No new labs needed   If you have labs (blood work) drawn today and your tests are completely normal, you will receive your results only by: Marland Kitchen MyChart Message (if you have MyChart) OR . A paper copy in the mail If you have any lab test that is abnormal or we need to change your treatment, we will call you to review the results.   Testing/Procedures: No new testing needed   Follow-Up: At [facility], you and your health needs are our priority.  As part of our continuing mission to provide you with exceptional heart care, we have created designated Provider Care Teams.  These Care Teams include your primary Cardiologist (physician) and Advanced Practice Providers (APPs -  Physician Assistants and Nurse Practitioners) who all work together to provide you with the care you need, when you need it.  . You will need a follow up appointment as needed   . Providers on your designated Care Team:   . Murray Hodgkins, NP . Christell Faith, PA-C . Marrianne Mood, PA-C  Any Other Special Instructions Will Be Listed Below (If Applicable).  For educational health videos Log in to : [email or web address] Or : [email or web address], password : [email or web address]

## 2019-02-14 DIAGNOSIS — J40 Bronchitis, not specified as acute or chronic: Secondary | ICD-10-CM | POA: Diagnosis not present

## 2019-02-14 DIAGNOSIS — Z20828 Contact with and (suspected) exposure to other viral communicable diseases: Secondary | ICD-10-CM | POA: Diagnosis not present

## 2019-02-19 DIAGNOSIS — J40 Bronchitis, not specified as acute or chronic: Secondary | ICD-10-CM | POA: Diagnosis not present

## 2019-02-19 DIAGNOSIS — Z20822 Contact with and (suspected) exposure to covid-19: Secondary | ICD-10-CM | POA: Diagnosis not present

## 2019-03-18 ENCOUNTER — Other Ambulatory Visit: Payer: Self-pay | Admitting: Cardiovascular Disease

## 2019-06-25 ENCOUNTER — Other Ambulatory Visit: Payer: Self-pay | Admitting: Internal Medicine

## 2019-06-25 DIAGNOSIS — R1010 Upper abdominal pain, unspecified: Secondary | ICD-10-CM

## 2019-06-26 ENCOUNTER — Ambulatory Visit
Admission: RE | Admit: 2019-06-26 | Discharge: 2019-06-26 | Disposition: A | Discharge status: Home / Self Care | Payer: 59 | Source: Ambulatory Visit | Attending: Internal Medicine | Admitting: Internal Medicine

## 2019-06-26 ENCOUNTER — Other Ambulatory Visit: Payer: Self-pay

## 2019-06-26 DIAGNOSIS — R1010 Upper abdominal pain, unspecified: Secondary | ICD-10-CM | POA: Insufficient documentation

## 2019-11-16 DEATH — deceased

## 2021-01-28 IMAGING — CT CT CHEST LUNG CANCER SCREENING LOW DOSE
2 of 5 series · 15 of 40 positions shown, 18 images · non-contrast
Comparison: 07/14/2017.

CLINICAL DATA: Former smoker, quit 14 years ago, 111 pack-year
history.

EXAM:
CT CHEST WITHOUT CONTRAST LOW-DOSE FOR LUNG CANCER SCREENING
TECHNIQUE: Multidetector CT imaging of the chest was performed following the
standard protocol without IV contrast.

[Series 3: lung · axial · 0.74mm/px · z∈[-1272,-927]mm · 12 of 381 slices shown, 15 images (1 of 2)]
[im 18/381  mediastinal]
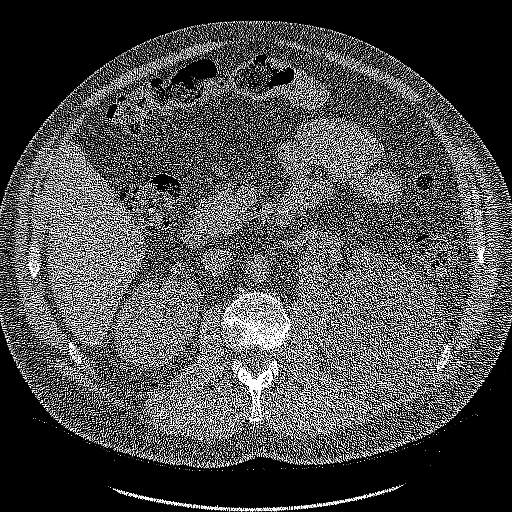
[im 18/381  lung]
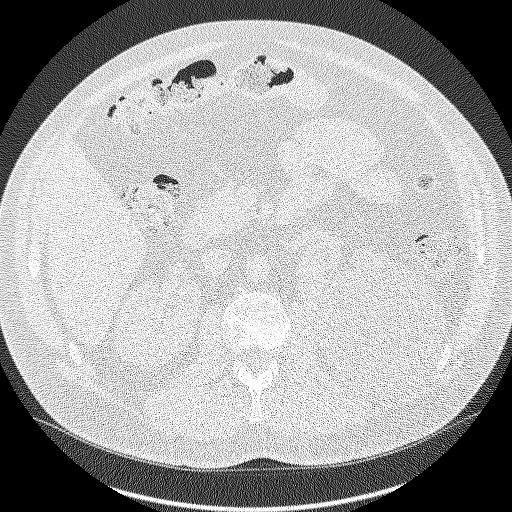
[im 52/381  lung]
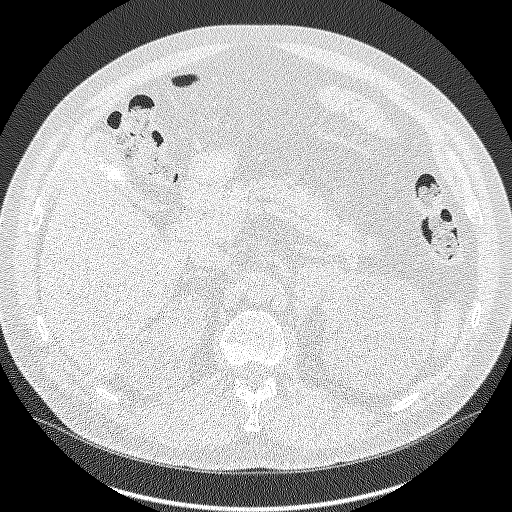
[im 87/381  lung]
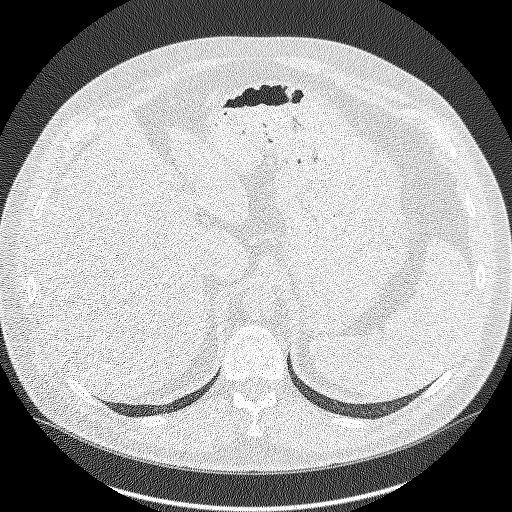
[im 121/381  lung]
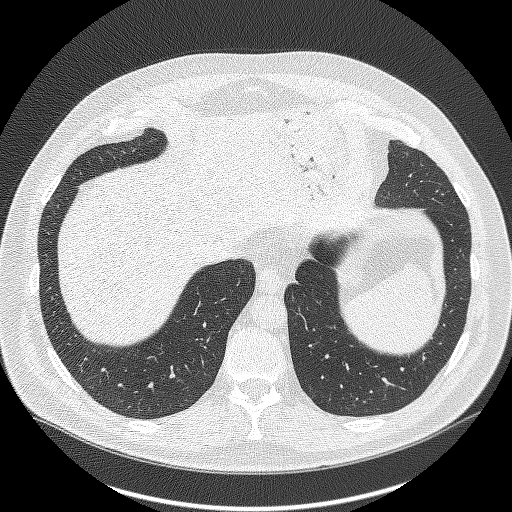
[im 139/381  mediastinal]
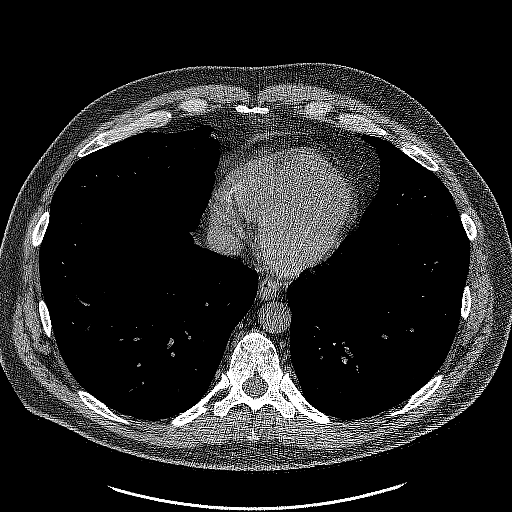
[im 139/381  lung]
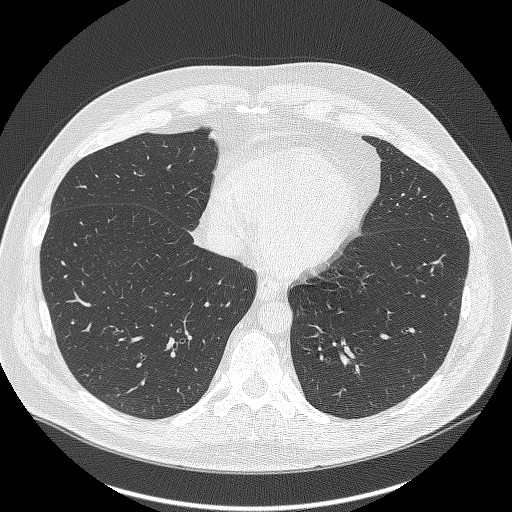
[im 173/381  lung]
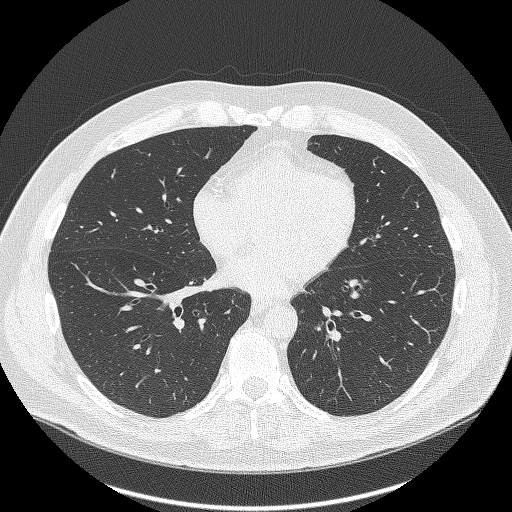
[im 208/381  lung]
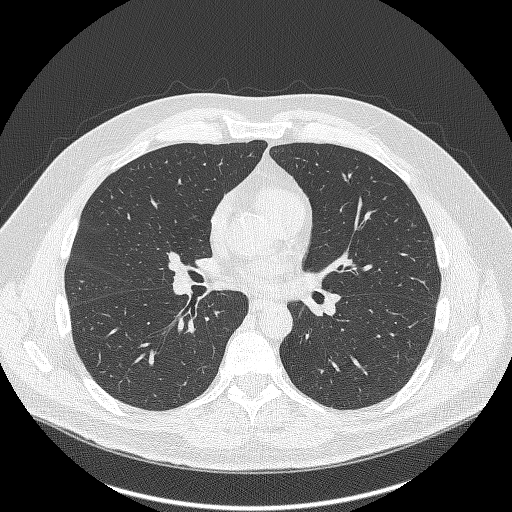
[im 242/381  lung]
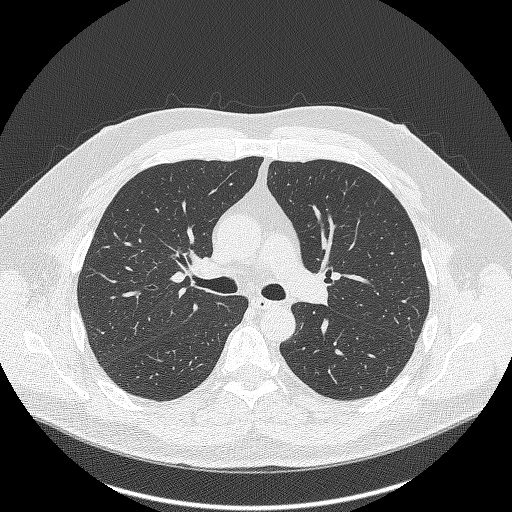
[im 260/381  mediastinal]
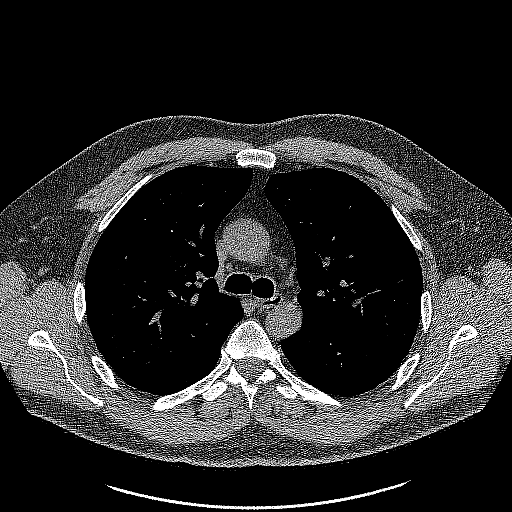
[im 260/381  lung]
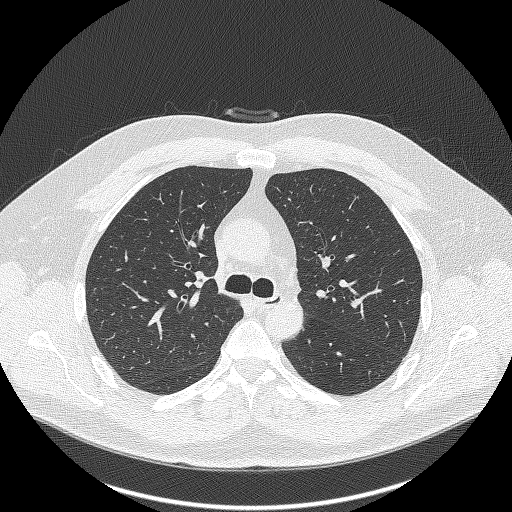
[im 294/381  lung]
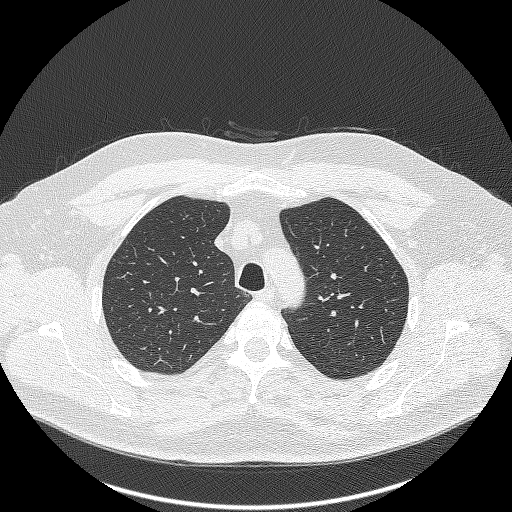
[im 329/381  lung]
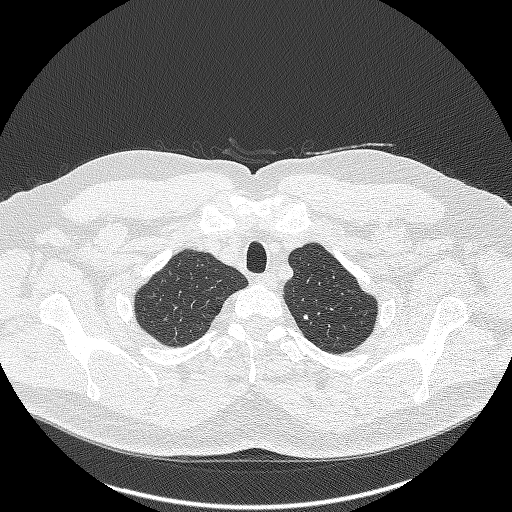
[im 363/381  lung]
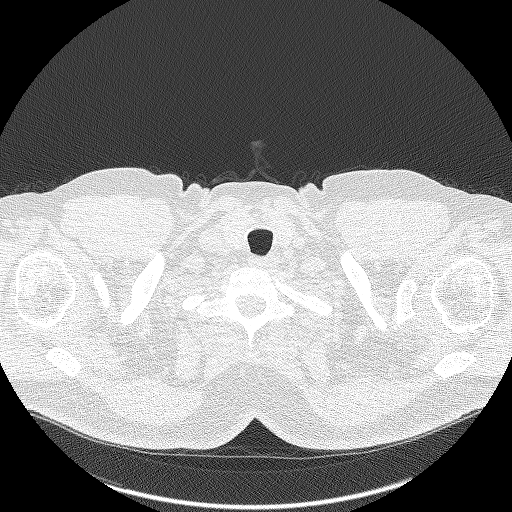

[Series 4: lung · coronal · 0.74mm/px · 3 of 339 slices shown (2 of 2)]
[im 68/339  lung]
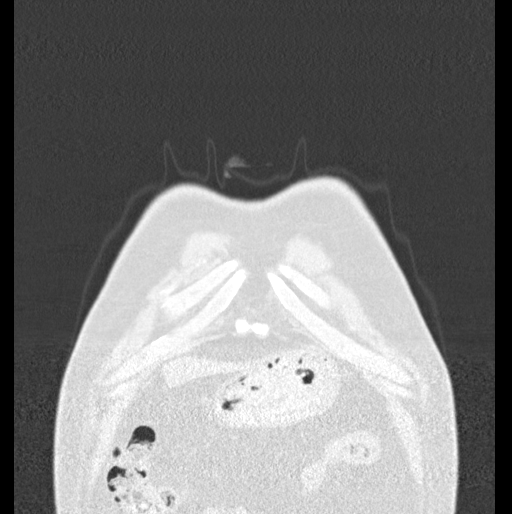
[im 136/339  lung]
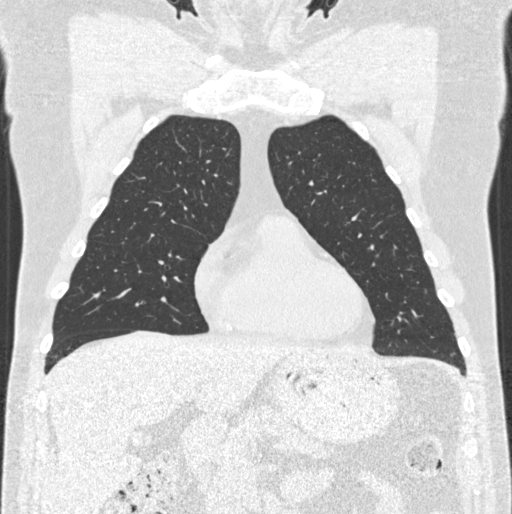
[im 203/339  lung]
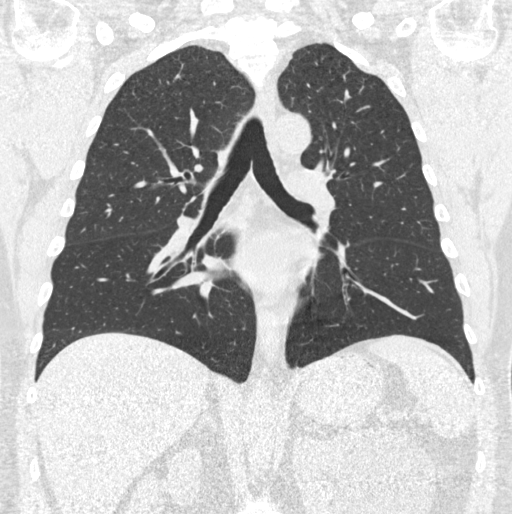

[15 of 40 positions shown; findings below may reference images not displayed]

FINDINGS: Cardiovascular: Atherosclerotic calcification of the aorta and
coronary arteries. Heart size normal. No pericardial effusion.

Mediastinum/Nodes: No pathologically enlarged mediastinal or
axillary lymph nodes. Hilar regions are difficult to definitively
evaluate without IV contrast but appear grossly unremarkable.
Esophagus is grossly unremarkable.

Lungs/Pleura: Centrilobular emphysema. Pulmonary nodules measure
mm or less in size, as before. No worrisome pulmonary nodules.
Calcified granuloma in the apically left upper lobe. Scattered
mucoid impaction. No pleural fluid. Airway is unremarkable.

Upper Abdomen: Visualized portion of the liver is unremarkable.
Stones are seen in the gallbladder. Adrenal glands are unremarkable.
Low-attenuation lesion off the upper pole right kidney measures
cm and is likely a cyst although definitive characterization is
limited without post-contrast imaging. 9.8 cm fluid density lesion
in the left kidney is incompletely imaged. Spleen and visualized
portions of the pancreas, stomach and bowel are unremarkable. No
upper abdominal adenopathy.

Musculoskeletal: No worrisome lytic or sclerotic lesions.
IMPRESSION: 1. Lung-RADS 2, benign appearance or behavior. Continue annual
screening with low-dose chest CT without contrast in 12 months.
2. Cholelithiasis.
3. Aortic atherosclerosis (7RQOC-170.0). Coronary artery
calcification.
4.  Emphysema (7RQOC-O8M.E).
